# Patient Record
Sex: Female | Born: 2005 | Race: White | Hispanic: No | Marital: Single | Smoking: Never smoker
Health system: Southern US, Community
[De-identification: ages and names within clinical notes are randomized; demographics above are authoritative.]

## PROBLEM LIST (undated history)

## (undated) DIAGNOSIS — F419 Anxiety disorder, unspecified: Secondary | ICD-10-CM

## (undated) DIAGNOSIS — T7840XA Allergy, unspecified, initial encounter: Secondary | ICD-10-CM

## (undated) DIAGNOSIS — J302 Other seasonal allergic rhinitis: Secondary | ICD-10-CM

## (undated) DIAGNOSIS — K219 Gastro-esophageal reflux disease without esophagitis: Secondary | ICD-10-CM

## (undated) HISTORY — DX: Anxiety disorder, unspecified: F41.9

## (undated) HISTORY — DX: Allergy, unspecified, initial encounter: T78.40XA

## (undated) HISTORY — DX: Other seasonal allergic rhinitis: J30.2

## (undated) HISTORY — DX: Gastro-esophageal reflux disease without esophagitis: K21.9

---
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
## 2023-01-03 ENCOUNTER — Encounter: Payer: Self-pay | Admitting: Family Medicine

## 2023-01-03 ENCOUNTER — Ambulatory Visit (INDEPENDENT_AMBULATORY_CARE_PROVIDER_SITE_OTHER): Payer: Commercial Managed Care - PPO | Admitting: Family Medicine

## 2023-01-03 VITALS — BP 119/84 | HR 93 | Temp 98.9°F | Resp 18 | Ht 65.77 in | Wt 143.2 lb

## 2023-01-03 DIAGNOSIS — Z0000 Encounter for general adult medical examination without abnormal findings: Secondary | ICD-10-CM | POA: Diagnosis not present

## 2023-01-03 DIAGNOSIS — J4520 Mild intermittent asthma, uncomplicated: Secondary | ICD-10-CM | POA: Diagnosis not present

## 2023-01-03 DIAGNOSIS — L700 Acne vulgaris: Secondary | ICD-10-CM

## 2023-01-03 LAB — CBC WITH DIFFERENTIAL/PLATELET
Basophils Absolute: 0 10*3/uL (ref 0.0–0.1)
Basophils Relative: 0.3 % (ref 0.0–3.0)
Eosinophils Absolute: 0.1 10*3/uL (ref 0.0–0.7)
Eosinophils Relative: 1 % (ref 0.0–5.0)
HCT: 40.7 % (ref 36.0–49.0)
Hemoglobin: 13.4 g/dL (ref 12.0–16.0)
Lymphocytes Relative: 22.9 % — ABNORMAL LOW (ref 24.0–48.0)
Lymphs Abs: 1.2 10*3/uL (ref 0.7–4.0)
MCHC: 32.9 g/dL (ref 31.0–37.0)
MCV: 93.3 fl (ref 78.0–98.0)
Monocytes Absolute: 0.4 10*3/uL (ref 0.1–1.0)
Monocytes Relative: 7.1 % (ref 3.0–12.0)
Neutro Abs: 3.6 10*3/uL (ref 1.4–7.7)
Neutrophils Relative %: 68.7 % (ref 43.0–71.0)
Platelets: 324 10*3/uL (ref 150.0–575.0)
RBC: 4.36 Mil/uL (ref 3.80–5.70)
RDW: 12.3 % (ref 11.4–15.5)
WBC: 5.2 10*3/uL (ref 4.5–13.5)

## 2023-01-03 LAB — COMPREHENSIVE METABOLIC PANEL
ALT: 9 U/L (ref 0–35)
AST: 14 U/L (ref 0–37)
Albumin: 4.7 g/dL (ref 3.5–5.2)
Alkaline Phosphatase: 75 U/L (ref 47–119)
BUN: 8 mg/dL (ref 6–23)
CO2: 24 meq/L (ref 19–32)
Calcium: 9.7 mg/dL (ref 8.4–10.5)
Chloride: 108 meq/L (ref 96–112)
Creatinine, Ser: 0.78 mg/dL (ref 0.40–1.20)
GFR: 110.32 mL/min (ref >60.00)
Glucose, Bld: 92 mg/dL (ref 70–99)
Potassium: 3.9 meq/L (ref 3.5–5.1)
Sodium: 140 meq/L (ref 135–145)
Total Bilirubin: 0.6 mg/dL (ref 0.2–1.2)
Total Protein: 7.4 g/dL (ref 6.0–8.3)

## 2023-01-03 LAB — LIPID PANEL
Cholesterol: 144 mg/dL (ref 0–200)
HDL: 54.7 mg/dL (ref >39.00)
LDL Cholesterol: 82 mg/dL (ref 0–99)
NonHDL: 89.04
Total CHOL/HDL Ratio: 3
Triglycerides: 33 mg/dL (ref 0.0–149.0)
VLDL: 6.6 mg/dL (ref 0.0–40.0)

## 2023-01-03 LAB — HEMOGLOBIN A1C: Hgb A1c MFr Bld: 5.1 % (ref 4.6–6.5)

## 2023-01-03 MED ORDER — DRYSOL 20 % EX SOLN: Freq: Every day | CUTANEOUS | 3 refills | Status: DC

## 2023-01-03 MED ORDER — NORGESTIM-ETH ESTRAD TRIPHASIC 0.18/0.215/0.25 MG-25 MCG PO TABS: 1.0000 | ORAL_TABLET | Freq: Every day | ORAL | 4 refills | Status: DC

## 2023-01-03 NOTE — Progress Notes (Signed)
Phone [phone]   Subjective:   Patient is a 17 y.o. female presenting for annual physical.    Chief Complaint  Patient presents with   Annual Exam    CPE Fasting   HPI  Annual- Exercising regularly and maintaining healthy diet.   Asthma- Reports she uses Albuterol inhaler as needed-doing much better  Allergies- Reports she stopped using Sinulair 10 mg 2-3 months ago since allergies have improved and uses Ryaltris 665-35 mcg. States she has established with allergist  Birth Control - Discussed restarting birth control; previously used OCP. Recently sexual active again. Periods have been regular. Always wears condoms  Foot Odor -  Reports using Drysol 20% solution as needed.    Acne - Reports using Bezoyl Peroxide 5% as needed. And wants to get back on ocp  See problem oriented charting- ROS- ROS: Gen: no fever, chills  Skin: no rash, itching ENT: no ear pain, ear drainage, nasal congestion, rhinorrhea, sinus pressure, sore throat Eyes: no blurry vision, double vision Resp: no cough, wheeze,SOB CV: no CP, palpitations, LE edema,  GI: no heartburn, n/v/d/c, abd pain GU: no dysuria, urgency, frequency, hematuria MSK: no joint pain, myalgias, back pain Neuro: no dizziness, headache, weakness, vertigo Psych: no depression, anxiety, insomnia, SI   The following were reviewed and entered/updated in epic: Past Medical History:  Diagnosis Date   Acid reflux    Not seen by primary for evaluation   Allergy    Anxiety    Seasonal allergies    Patient Active Problem List   Diagnosis Date Noted   Respiratory infection 05/16/2022   Seasonal and perennial allergic rhinitis 04/25/2022   Mild intermittent asthma without complication 04/25/2022   Acid reflux    Acid reflux    History reviewed. No pertinent surgical history.  Family History  Problem Relation Age of Onset   Drug abuse Mother    Depression Mother    Alcohol abuse Mother        in recovery    ADD / ADHD  Mother    Allergic rhinitis Father    Heart disease Father    Healthy Father    Eczema Brother    Asthma Brother    Depression Maternal Aunt    Depression Maternal Grandmother     Medications- reviewed and updated Current Outpatient Medications  Medication Sig Dispense Refill   albuterol (VENTOLIN HFA) 108 (90 Base) MCG/ACT inhaler Inhale 2 puffs into the lungs every 6 (six) hours as needed for wheezing or shortness of breath. 6.7 g 0   benzoyl peroxide 5 % external liquid Apply topically 2 (two) times daily. 142 g 12   cetirizine (ZYRTEC) 10 MG chewable tablet Chew 10 mg by mouth as needed for allergies.     montelukast (SINGULAIR) 10 MG tablet Take 1 tablet (10 mg total) by mouth at bedtime. 30 tablet 5   Multiple Vitamin (MULTIVITAMIN PO) Take by mouth.     Norgestimate-Ethinyl Estradiol Triphasic 0.18/0.215/0.25 MG-25 MCG tab Take 1 tablet by mouth daily. 84 tablet 4   Olopatadine-Mometasone (RYALTRIS) 665-25 MCG/ACT SUSP Place 1-2 sprays into the nose in the morning and at bedtime. 29 g 5   DRYSOL 20 % external solution Apply topically at bedtime. 60 mL 3   No current facility-administered medications for this visit.    Allergies-reviewed and updated Allergies  Allergen Reactions   Cefuroxime Axetil Hives and Other (See Comments)    Other reaction(s): Other (See Comments)  Hives   Cefdinir Rash and Other (  See Comments)    Other reaction(s): Other (See Comments)  Other Reaction(s): Hives    Social History   Social History Narrative   [employer]   [place] in [place].   Objective  Objective:  BP 119/84   Pulse 93   Temp 98.9 F (37.2 C) (Temporal)   Resp 18   Ht 5' 5.77" (1.671 m)   Wt 143 lb 4 oz (65 kg)   LMP 12/29/2022 (Exact Date)   SpO2 98%   BMI 23.28 kg/m  Physical Exam  Gen: WDWN NAD HEENT: NCAT, conjunctiva not injected, sclera nonicteric TM WNL B, OP moist, no exudates  NECK:  supple, no thyromegaly, no nodes, no carotid  bruits CARDIAC: RRR, S1S2+, no murmur. DP 2+B LUNGS: CTAB. No wheezes ABDOMEN:  BS+, soft, NTND, No HSM, no masses EXT:  no edema MSK: no gross abnormalities. MS 5/5 all 4 NEURO: A&O x3.  CN II-XII intact.  PSYCH: normal mood. Good eye contact      Assessment and Plan   Health Maintenance counseling: 1. Anticipatory guidance: Patient counseled regarding regular dental exams q6 months, eye exams,  avoiding smoking and second hand smoke, limiting alcohol to 1 beverage per day, no illicit drugs.   2. Risk factor reduction:  Advised patient of need for regular exercise and diet rich and fruits and vegetables to reduce risk of heart attack and stroke. Exercise- Regular exercise.  Wt Readings from Last 3 Encounters:  01/03/23 143 lb 4 oz (65 kg) (75%, Z= 0.68)*  06/27/22 147 lb (66.7 kg) (80%, Z= 0.86)*  04/25/22 148 lb (67.1 kg) (82%, Z= 0.90)*   * Growth percentiles are based on CDC (Girls, 2-20 Years) data.   3. Immunizations/screenings/ancillary studies Immunization History  Administered Date(s) Administered   HPV 9-valent 01/19/2018, 09/03/2019   Influenza,inj,Quad PF,6+ Mos 02/14/2014, 05/05/2017, 01/19/2018   Influenza-Unspecified 12/20/2015   Meningococcal Conjugate 12/20/2015   PFIZER Comirnaty(Gray Top)Covid-19 Tri-Sucrose Vaccine 11/18/2020   PFIZER(Purple Top)SARS-COV-2 Vaccination 09/29/2019, 10/20/2019   Tdap 12/20/2015   There are no preventive care reminders to display for this patient.   4. Cervical cancer screening: N/A 5. Skin cancer screening- advised regular sunscreen use. Denies worrisome, changing, or new skin lesions.  6. Birth control/STD check: Discussed restarting birth control. She wasn't sexually active when she stopped them.  7. Smoking associated screening: non-smoker 8. Alcohol screening: N/A  Wellness examination -     CBC with Differential/Platelet -     Comprehensive metabolic panel -     Lipid panel -     TSH -     Hemoglobin A1c  Mild  intermittent asthma without complication  Acne vulgaris  Other orders -     Drysol; Apply topically at bedtime.  Dispense: 60 mL; Refill: 3 -     Norgestim-Eth Estrad Triphasic; Take 1 tablet by mouth daily.  Dispense: 84 tablet; Refill: 4   Wellness-anticipatory guidance.  Work on Diet/Exercise  Check CBC,CMP,lipids,TSH, A1C.  F/u 1 yr  will restart ortho tricyclen for contraception/acne Asthma-well controlled.  Care per allergist Acne-continue benzoyl peroxide.  Restart ortho tricyclen   Recommended follow up: Return in about 1 year (around 01/03/2024) for annual physical.  Lab/Order associations: + fasting   I,Rachel Rivera,acting as a scribe for Angelena Sole, MD.,have documented all relevant documentation on the behalf of Angelena Sole, MD,as directed by  Angelena Sole, MD while in the presence of Angelena Sole, MD.  I, Angelena Sole, MD, have reviewed all documentation  for this visit. The documentation on 01/03/23 for the exam, diagnosis, procedures, and orders are all accurate and complete.     Angelena Sole, MD

## 2023-01-03 NOTE — Patient Instructions (Signed)

## 2023-01-05 LAB — TSH: TSH: 1.27 u[IU]/mL (ref 0.40–5.00)

## 2023-02-21 NOTE — Patient Instructions (Incomplete)
Seasonal and perennial allergic rhinitis 2023 skin testing showed: Positive to grass, weed, trees, ragweed, dust mites. Continue environmental control measures as below. Use over the counter antihistamines such as Zyrtec (cetirizine), Claritin (loratadine), Allegra (fexofenadine), or Xyzal (levocetirizine) daily as needed. May take twice a day during allergy flares. May switch antihistamines every few months. Restart Singulair (montelukast) 10mg  daily at night. Patient cautioned that rarely some children/adults can experience behavioral changes after beginning montelukast. These side effects are rare, however, if you notice any change, notify the clinic and discontinue montelukast.  May use Ryaltris (olopatadine + mometasone nasal spray combination) 1-2 sprays per nostril twice a day.  May use saline gel for nasal dryness Nasal saline spray (i.e., Simply Saline) or nasal saline lavage (i.e., NeilMed) is recommended as needed and prior to medicated nasal sprays. Consider allergy injections for long term control if above medications do not help the symptoms - handout mailed. Schedule an appointment to start allergy injections when you are back during Thanksgiving break. Forms given for her university to sign .  It would be one shot.  Emergency action plan given.  Prescription for EpiPen sent.  Demonstration given on how to use EpiPen  Mild intermittent asthma We will get a chest x-ray due to the black sputum. We will call you with results once they are back Restart Singulair (montelukast) 10mg  daily at night. May use albuterol rescue inhaler 2 puffs every 4 to 6 hours as needed for shortness of breath, chest tightness, coughing, and wheezing. Monitor frequency of use.   Follow up in 3 months or sooner if needed  Reducing Pollen Exposure Pollen seasons: trees (spring), grass (summer) and ragweed/weeds (fall). Keep windows closed in your home and car to lower pollen exposure.  Install air  conditioning in the bedroom and throughout the house if possible.  Avoid going out in dry windy days - especially early morning. Pollen counts are highest between 5 - 10 AM and on dry, hot and windy days.  Save outside activities for late afternoon or after a heavy rain, when pollen levels are lower.  Avoid mowing of grass if you have grass pollen allergy. Be aware that pollen can also be transported indoors on people and pets.  Dry your clothes in an automatic dryer rather than hanging them outside where they might collect pollen.  Rinse hair and eyes before bedtime.  Control of House Dust Mite Allergen Dust mite allergens are a common trigger of allergy and asthma symptoms. While they can be found throughout the house, these microscopic creatures thrive in warm, humid environments such as bedding, upholstered furniture and carpeting. Because so much time is spent in the bedroom, it is essential to reduce mite levels there.  Encase pillows, mattresses, and box springs in special allergen-proof fabric covers or airtight, zippered plastic covers.  Bedding should be washed weekly in hot water (130 F) and dried in a hot dryer. Allergen-proof covers are available for comforters and pillows that can't be regularly washed.  Wash the allergy-proof covers every few months. Minimize clutter in the bedroom. Keep pets out of the bedroom.  Keep humidity less than 50% by using a dehumidifier or air conditioning. You can buy a humidity measuring device called a hygrometer to monitor this.  If possible, replace carpets with hardwood, linoleum, or washable area rugs. If that's not possible, vacuum frequently with a vacuum that has a HEPA filter. Remove all upholstered furniture and non-washable window drapes from the bedroom. Remove all non-washable stuffed toys  from the bedroom.  Wash stuffed toys weekly.

## 2023-02-22 ENCOUNTER — Other Ambulatory Visit: Payer: Self-pay

## 2023-02-22 ENCOUNTER — Ambulatory Visit (HOSPITAL_COMMUNITY)
Admission: RE | Admit: 2023-02-22 | Discharge: 2023-02-22 | Disposition: A | Discharge status: Home / Self Care | Payer: Commercial Managed Care - PPO | Source: Ambulatory Visit | Attending: Family | Admitting: Family

## 2023-02-22 ENCOUNTER — Ambulatory Visit: Payer: Commercial Managed Care - PPO | Admitting: Family

## 2023-02-22 ENCOUNTER — Encounter: Payer: Self-pay | Admitting: Family

## 2023-02-22 VITALS — BP 104/70 | HR 81 | Temp 98.3°F | Resp 16 | Wt 146.3 lb

## 2023-02-22 DIAGNOSIS — J3089 Other allergic rhinitis: Secondary | ICD-10-CM | POA: Diagnosis not present

## 2023-02-22 DIAGNOSIS — R058 Other specified cough: Secondary | ICD-10-CM | POA: Diagnosis not present

## 2023-02-22 DIAGNOSIS — J4520 Mild intermittent asthma, uncomplicated: Secondary | ICD-10-CM | POA: Diagnosis not present

## 2023-02-22 DIAGNOSIS — J302 Other seasonal allergic rhinitis: Secondary | ICD-10-CM

## 2023-02-22 MED ORDER — EPINEPHRINE 0.3 MG/0.3ML IJ SOAJ: 0.3000 mg | INTRAMUSCULAR | 0 refills | Status: AC | PRN

## 2023-02-22 NOTE — Progress Notes (Signed)
[address] [place] [address] Dept: [phone]  FOLLOW UP NOTE  Patient ID: Heather Holloway, female    DOB: 01/05/2006  Age: 17 y.o. MRN: [id number] Date of Office Visit: 02/22/2023  Assessment  Chief Complaint: Other (Mucous that was black happened last week. Random in nature.)  HPI Heather Holloway is a 17 year old female who presents today for an acute visit of black sputum.  She was last seen on June 27, 2022 by Dr. Selena Batten for seasonal and perennial allergic rhinitis and mild intermittent asthma.  She denies any new diagnosis or surgery since her last office visit.  She reports that on October 3 she coughed up sputum that had black in it once.  She  used to have a persistent cough that was disruptive.  That cough is now gone.  She now has a cough that occurs randomly due to drainage.  She has not coughed up the black sputum again.  She denies any fever or chills.  She does not smoke.  She is not currently pregnant.  She reports that this could have happened before, but this is the first time she has looked at her sputum.  She does have a photo on her phone of the sputum.  Seasonal and perennial allergic rhinitis: She reports off-and-on rhinorrhea, nasal congestion, and postnasal drip at times.  Her rhinorrhea can be light yellow or green or sometimes clear.  She reports that she probably had a sinus infection last March.  She is interested in starting allergy injections.  She is in school right now at [employer].  She reports that she has already called her insurance to find out the cost.  She is currently taking Zyrtec 10 mg once a day and Ryaltris nasal spray maybe twice a week.  She stopped taking Singulair 10 mg at night.  She did not have any problems while on this medication.  She thought that it did help.  She does mention that sometimes if she uses Ryaltris too much she feels like it dries her out.  She reports that she will occasionally have nosebleeds.  Her last nosebleed was 2  weeks ago.  Mild intermittent asthma: She reports cough due to drainage otherwise she does not have cough.  She denies wheezing, tightness in chest, shortness of breath, and nocturnal awakenings due to breathing problems.  Since her last office visit she has not required any systemic steroids or made any trips to the emergency room or urgent care due to breathing problems.  She reports she has used her albuterol inhaler once for a coughing fit since we last saw her.  She is currently not taking Singulair 10 mg at night.   Drug Allergies:  Allergies  Allergen Reactions   Cefuroxime Axetil Hives and Other (See Comments)    Other reaction(s): Other (See Comments)  Hives   Cefdinir Rash and Other (See Comments)    Other reaction(s): Other (See Comments)  Other Reaction(s): Hives    Review of Systems: Negative except as per HPI   Physical Exam: BP 104/70   Pulse 81   Temp 98.3 F (36.8 C) (Temporal)   Resp 16   Wt 146 lb 4.8 oz (66.4 kg)   SpO2 98%   BMI 23.78 kg/m    Physical Exam Constitutional:      Appearance: Normal appearance.  HENT:     Head: Normocephalic and atraumatic.     Comments: Pharynx normal, eyes normal, ears normal, nose: Bilateral lower turbinates mildly  edematous with no drainage noted    Right Ear: Tympanic membrane, ear canal and external ear normal.     Left Ear: Tympanic membrane, ear canal and external ear normal.     Mouth/Throat:     Mouth: Mucous membranes are moist.     Pharynx: Oropharynx is clear.  Eyes:     Conjunctiva/sclera: Conjunctivae normal.  Cardiovascular:     Rate and Rhythm: Regular rhythm.     Heart sounds: Normal heart sounds.  Pulmonary:     Effort: Pulmonary effort is normal.     Breath sounds: Normal breath sounds.     Comments: Lungs clear to auscultation Musculoskeletal:     Cervical back: Neck supple.  Skin:    General: Skin is warm.  Neurological:     Mental Status: She is alert and oriented to person, place, and  time.  Psychiatric:        Mood and Affect: Mood normal.        Behavior: Behavior normal.        Thought Content: Thought content normal.        Judgment: Judgment normal.     Diagnostics: FVC 3.36 L (84%), FEV1 2.94 L (84%), FEV1/FVC 0.88.  Spirometry indicates normal spirometry.  Assessment and Plan: 1. Sputum production   2. Mild intermittent asthma without complication   3. Seasonal and perennial allergic rhinitis     Meds ordered this encounter  Medications   EPINEPHrine 0.3 mg/0.3 mL IJ SOAJ injection    Sig: Inject 0.3 mg into the muscle as needed for anaphylaxis.    Dispense:  2 each    Refill:  0    Patient Instructions  Seasonal and perennial allergic rhinitis 2023 skin testing showed: Positive to grass, weed, trees, ragweed, dust mites. Continue environmental control measures as below. Use over the counter antihistamines such as Zyrtec (cetirizine), Claritin (loratadine), Allegra (fexofenadine), or Xyzal (levocetirizine) daily as needed. May take twice a day during allergy flares. May switch antihistamines every few months. Restart Singulair (montelukast) 10mg  daily at night. Patient cautioned that rarely some children/adults can experience behavioral changes after beginning montelukast. These side effects are rare, however, if you notice any change, notify the clinic and discontinue montelukast.  May use Ryaltris (olopatadine + mometasone nasal spray combination) 1-2 sprays per nostril twice a day.  May use saline gel for nasal dryness Nasal saline spray (i.e., Simply Saline) or nasal saline lavage (i.e., NeilMed) is recommended as needed and prior to medicated nasal sprays. Consider allergy injections for long term control if above medications do not help the symptoms - handout mailed. Schedule an appointment to start allergy injections when you are back during Thanksgiving break. Forms given for her university to sign .  It would be one shot.  Emergency action  plan given.  Prescription for EpiPen sent.  Demonstration given on how to use EpiPen  Mild intermittent asthma We will get a chest x-ray due to the black sputum. We will call you with results once they are back Restart Singulair (montelukast) 10mg  daily at night. May use albuterol rescue inhaler 2 puffs every 4 to 6 hours as needed for shortness of breath, chest tightness, coughing, and wheezing. Monitor frequency of use.   Follow up in 3 months or sooner if needed  Reducing Pollen Exposure Pollen seasons: trees (spring), grass (summer) and ragweed/weeds (fall). Keep windows closed in your home and car to lower pollen exposure.  Install air conditioning in the bedroom and throughout the  house if possible.  Avoid going out in dry windy days - especially early morning. Pollen counts are highest between 5 - 10 AM and on dry, hot and windy days.  Save outside activities for late afternoon or after a heavy rain, when pollen levels are lower.  Avoid mowing of grass if you have grass pollen allergy. Be aware that pollen can also be transported indoors on people and pets.  Dry your clothes in an automatic dryer rather than hanging them outside where they might collect pollen.  Rinse hair and eyes before bedtime.  Control of House Dust Mite Allergen Dust mite allergens are a common trigger of allergy and asthma symptoms. While they can be found throughout the house, these microscopic creatures thrive in warm, humid environments such as bedding, upholstered furniture and carpeting. Because so much time is spent in the bedroom, it is essential to reduce mite levels there.  Encase pillows, mattresses, and box springs in special allergen-proof fabric covers or airtight, zippered plastic covers.  Bedding should be washed weekly in hot water (130 F) and dried in a hot dryer. Allergen-proof covers are available for comforters and pillows that can't be regularly washed.  Wash the allergy-proof covers every  few months. Minimize clutter in the bedroom. Keep pets out of the bedroom.  Keep humidity less than 50% by using a dehumidifier or air conditioning. You can buy a humidity measuring device called a hygrometer to monitor this.  If possible, replace carpets with hardwood, linoleum, or washable area rugs. If that's not possible, vacuum frequently with a vacuum that has a HEPA filter. Remove all upholstered furniture and non-washable window drapes from the bedroom. Remove all non-washable stuffed toys from the bedroom.  Wash stuffed toys weekly.  Return in about 3 months (around 05/25/2023), or if symptoms worsen or fail to improve.    Thank you for the opportunity to care for this patient.  Please do not hesitate to contact me with questions.  Nehemiah Settle, FNP [facility]

## 2023-02-22 NOTE — Progress Notes (Signed)
Please let Heather Holloway know that her chest x-ray looks good and does not show any active cardiopulmonary disease.

## 2023-03-01 ENCOUNTER — Telehealth: Payer: Self-pay | Admitting: Allergy

## 2023-03-01 ENCOUNTER — Other Ambulatory Visit: Payer: Self-pay | Admitting: Allergy

## 2023-03-01 DIAGNOSIS — J3089 Other allergic rhinitis: Secondary | ICD-10-CM

## 2023-03-01 MED ORDER — MONTELUKAST SODIUM 10 MG PO TABS: 10.0000 mg | ORAL_TABLET | Freq: Every day | ORAL | 5 refills | Status: DC

## 2023-03-01 NOTE — Telephone Encounter (Signed)
Sent in montelukast and informed pt of me doing so to right [facility] [place]

## 2023-03-01 NOTE — Telephone Encounter (Signed)
Patient called and stated her prescription for Singulair was sent over to her home town Pharmacy. Patient goes to college in [place] and wants the prescription sent [facility] to [facility] at [address] [place], [place] [address]. Patient asked if someone could give her a call once that is sent in.  Best Contact: [phone]

## 2023-03-01 NOTE — Addendum Note (Signed)
Addended by: Berna Bue on: 03/01/2023 11:53 AM   Modules accepted: Orders

## 2023-03-02 DIAGNOSIS — J301 Allergic rhinitis due to pollen: Secondary | ICD-10-CM | POA: Diagnosis not present

## 2023-03-02 NOTE — Progress Notes (Signed)
EXP 03/01/24

## 2023-03-02 NOTE — Progress Notes (Signed)
Aeroallergen Immunotherapy  Ordering Provider: Dr. Wyline Mood  Patient Details Name: Heather Holloway MRN: [id number] Date of Birth: 10/05/2005  Order 1 of 1  Vial Label: G-RW-W-T-DM  0.3 ml (Volume)  BAU Concentration -- 7 Grass Mix* 100,000 ([address] [place], [place], [place], [place], [place], Sweet Vernal, Timothy) 0.3 ml (Volume)  BAU Concentration -- [place] 10,000 0.2 ml (Volume)  1:20 Concentration -- [place] 0.3 ml (Volume)  1:20 Concentration -- Ragweed Mix 0.5 ml (Volume)  1:20 Concentration -- Weed Mix* 0.5 ml (Volume)  1:20 Concentration -- Eastern 10 Tree Mix (also Sweet Gum) 0.2 ml (Volume)  1:20 Concentration -- Box Elder 0.2 ml (Volume)  1:10 Concentration -- Pecan Pollen 0.2 ml (Volume)  1:20 Concentration -- Walnut, Black Pollen 0.5 ml (Volume)   AU Concentration -- Mite Mix (DF 5,000 & DP 5,000)   3.2  ml Extract Subtotal 1.8  ml Diluent 5.0  ml Maintenance Total  Schedule:  B Blue Vial (1:100,000): Schedule B (6 doses) Yellow Vial (1:10,000): Schedule B (6 doses) Green Vial (1:1,000): Schedule B (6 doses) Red Vial (1:100): Schedule A (14 doses)  Special Instructions: may come in 1-2 times per week during build up. Only once per week on red.

## 2023-03-16 ENCOUNTER — Ambulatory Visit: Payer: Commercial Managed Care - PPO

## 2023-04-10 ENCOUNTER — Ambulatory Visit (INDEPENDENT_AMBULATORY_CARE_PROVIDER_SITE_OTHER): Payer: Commercial Managed Care - PPO

## 2023-04-10 DIAGNOSIS — J309 Allergic rhinitis, unspecified: Secondary | ICD-10-CM

## 2023-04-10 NOTE — Progress Notes (Signed)
Immunotherapy   Patient Details  Name: Heather Holloway MRN: [id number] Date of Birth: Feb 09, 2006  04/10/2023  [name] started injections for G-RW-W-T-DM. Patient received 0.05 ml out of her blue vial with an exp date of 03/01/2024. Patient waited 30 minutes with no problems. Following schedule: B  Frequency:1-2 times weekly Epi-Pen:Epi-Pen Available  Consent signed and patient instructions given.   Dub Mikes 04/10/2023, 1:39 PM

## 2023-05-04 ENCOUNTER — Ambulatory Visit (INDEPENDENT_AMBULATORY_CARE_PROVIDER_SITE_OTHER): Payer: Commercial Managed Care - PPO

## 2023-05-04 DIAGNOSIS — J309 Allergic rhinitis, unspecified: Secondary | ICD-10-CM

## 2023-07-04 DIAGNOSIS — J029 Acute pharyngitis, unspecified: Secondary | ICD-10-CM | POA: Diagnosis not present

## 2023-07-04 DIAGNOSIS — J020 Streptococcal pharyngitis: Secondary | ICD-10-CM | POA: Diagnosis not present

## 2023-08-14 DIAGNOSIS — J029 Acute pharyngitis, unspecified: Secondary | ICD-10-CM | POA: Diagnosis not present

## 2023-08-14 DIAGNOSIS — Z20828 Contact with and (suspected) exposure to other viral communicable diseases: Secondary | ICD-10-CM | POA: Diagnosis not present

## 2023-12-28 ENCOUNTER — Encounter: Payer: Commercial Managed Care - PPO | Admitting: Family Medicine

## 2024-04-08 DIAGNOSIS — D224 Melanocytic nevi of scalp and neck: Secondary | ICD-10-CM | POA: Diagnosis not present

## 2024-04-08 DIAGNOSIS — D2262 Melanocytic nevi of left upper limb, including shoulder: Secondary | ICD-10-CM | POA: Diagnosis not present

## 2024-04-08 DIAGNOSIS — D225 Melanocytic nevi of trunk: Secondary | ICD-10-CM | POA: Diagnosis not present

## 2024-04-22 DIAGNOSIS — R519 Headache, unspecified: Secondary | ICD-10-CM | POA: Diagnosis not present

## 2024-04-22 DIAGNOSIS — J029 Acute pharyngitis, unspecified: Secondary | ICD-10-CM | POA: Diagnosis not present

## 2024-04-22 DIAGNOSIS — R5383 Other fatigue: Secondary | ICD-10-CM | POA: Diagnosis not present

## 2024-04-22 NOTE — Progress Notes (Signed)
 "  Chief Complaint:  Chief Complaint  Patient presents with   Sore Throat   PCP: Dr, No Pcp/Family  Assessment/Plan    ICD-10-CM   1. Sore throat  J02.9 STREP SCREEN RAPID GROUP A(AMB USE ONLY)    POC SARS-COV2, INFLU. A/B ANTIGEN FIA    CHLAMYDIA-GC, RNA, TMA,THROAT (V8267)    CULT THROAT [facility])       Assessment & Plan Initial Assessment: 18 year old female with sore throat, fatigue, headache, decreased appetite, and diarrhea since Sunday. Recent unprotected oral sex on Friday night. Concern for potential STI and throat infection. Negative rapid strep test in clinic.  Differential Diagnosis: - Strep throat: Hallmark symptoms present. Rapid strep test negative. Send swab for culture. - Gonorrhea: Recent unprotected oral sex. Throat infection resembling strep. Send throat nucleic acid amplification test. - Mononucleosis: Symptoms consistent. Exposure at social event. Early test unreliable.  Urgent Care Course: - Negative rapid strep test in clinic - Throat culture sent - Throat nucleic acid amplification test for gonorrhea and chlamydia sent  Final Assessment: Negative rapid strep test. Throat culture and nucleic acid amplification test sent. Symptomatic management initiated.  Clinical Impression: - Pharyngitis  Disposition: - Follow-Up: Await throat culture and nucleic acid amplification test results. Contact for additional treatment if positive.  Patient Education: Ibuprofen 600-800 mg up to every 8 hours. High fluid intake. Cepacol spray or lozenges for numbing. Saltwater gargles.  Sidenote: confirmed hx of hives with cefdinir.  Unsure about other cephalosporins.  Can take amoxicillin  safely  --------------------------------------------------- ---------------------------------------------------  Subjective  History of Present Illness This is a 18 year old female presenting with sore throat, fatigue, headache, and decreased appetite since 04/20/2024. She reports  having unprotected oral sex on 04/18/2024 and is concerned about a potential STI in her throat.  She reports experiencing symptoms of pharyngitis, including erythematous tonsils and dysphagia, which began on 04/20/2024. She also describes fatigue, chills, headache, and a sore throat. A mild cough is present, which she notes is a common occurrence for her, along with expectoration of phlegm. She has been experiencing diarrhea but reports no other gastrointestinal symptoms such as nausea or vomiting. She has a history of recurrent streptococcal infections, with this episode being the fourth within the current year. She has not sought any medical treatment for her symptoms but has attempted self-care measures such as ibuprofen and saltwater gargles. She expresses concern about potential exposure to mononucleosis at a social event where she may have shared drinks with others. She requests a medical note for an upcoming examination due to her current illness.  Patient Active Problem List  Diagnosis   Acid reflux   Mild intermittent asthma without complication   Seasonal and perennial allergic rhinitis     Outpatient Medications as of 04/22/2024  Medication Indication(s) Sig   cetirizine (ZYRTEC) 10 mg Tablet   take 10 mg every day by mouth .     Allergies  Allergen Reactions   Cefdinir Hives     ROS  Negative unless otherwise indicated  Objective  Vitals:   04/22/24 1707  BP: 128/77  Pulse: 114  Resp: 16  Temp: 98.8 F (37.1 C)  SpO2: 98%  Weight: 63.5 kg (140 lb)  Height: 1.651 m (5' 5)     Physical Exam  Physical Exam General Appearance: Alert and oriented x3. No acute distress. HEENT: Nasal mucosa mildly congested. Ears normal. Oropharynx shows prominent bilateral tonsillar hypertrophy and multiple white exudates bilaterally. No midline shift or asymmetry. Respiratory: Lungs clear to auscultation bilaterally.  Cardiovascular: Heart regular rate and rhythm with no murmurs,  gallops, or rubs. Lymphatic: Tender cervical lymph node enlargement. Skin: Warm and dry, no visible rash. Neurological: No global CNS or focal neurologic deficits to interaction. Psychiatric: Normal.    Results for orders placed or performed in visit on 04/22/24  STREP SCREEN RAPID GROUP A(AMB USE ONLY)   Specimen: THROAT  Result Value Ref Range   AMB RAPID STREP V2 Negative Negative   Internal control result ok Valid  POC SARS-COV2, INFLU. A/B ANTIGEN FIA  Result Value Ref Range   SARS-COV-2 Antigen Negative Negative, Indeterminate, Not Detected, Valid   Flu A Antigen Negative Negative, Indeterminate, Not Detected, Valid   Flu B Antigen Negative Negative, Indeterminate, Not Detected, Valid     No results found.   Results - Laboratory Studies:   - Negative rapid strep test   - COVID-19 test pending   - Influenza test pending    No images are attached to the encounter.   Procedures   Discussed with the patient that I would be using [employer] notetaking software, and obtained consent prior to conducting the visit Portions of this note were dictated using Dragon medical dictation software and may contain occasional typographical errors.  Please seek clarification from the author as needed.  Francis SHAUNNA Irish, MD, Family Physician "

## 2024-05-19 ENCOUNTER — Ambulatory Visit
Admission: RE | Admit: 2024-05-19 | Discharge: 2024-05-19 | Disposition: A | Discharge status: Home / Self Care | Attending: Family Medicine | Admitting: Family Medicine

## 2024-05-19 ENCOUNTER — Ambulatory Visit: Payer: Self-pay

## 2024-05-19 VITALS — BP 124/84 | HR 85 | Temp 97.9°F | Resp 18 | Ht 65.0 in | Wt 145.0 lb

## 2024-05-19 DIAGNOSIS — J329 Chronic sinusitis, unspecified: Secondary | ICD-10-CM

## 2024-05-19 DIAGNOSIS — J069 Acute upper respiratory infection, unspecified: Secondary | ICD-10-CM | POA: Diagnosis not present

## 2024-05-19 MED ORDER — AZELASTINE-FLUTICASONE 137-50 MCG/ACT NA SUSP: 1.0000 | Freq: Two times a day (BID) | NASAL | 0 refills | Status: AC

## 2024-05-19 MED ORDER — AZITHROMYCIN 250 MG PO TABS: 250.0000 mg | ORAL_TABLET | Freq: Every day | ORAL | 0 refills | Status: AC

## 2024-05-19 MED ORDER — BENZONATATE 200 MG PO CAPS: 200.0000 mg | ORAL_CAPSULE | Freq: Three times a day (TID) | ORAL | 0 refills | Status: AC | PRN

## 2024-05-19 NOTE — ED Triage Notes (Signed)
 Pt states that she has a cough and chest congestion. X3-4 days  Pt states that she recently has been treated for Mono.   Pt states that she has been taking Mucinex DM.

## 2024-05-19 NOTE — Telephone Encounter (Signed)
 FYI Only or Action Required?: FYI only for provider: going to UC.  Patient was last seen in primary care on 01/03/2023 by Wendolyn Jenkins Jansky, MD.  Called Nurse Triage reporting Cough.  Symptoms began several days ago.  Interventions attempted: OTC medications: throat spray.  Symptoms are: unchanged.  Triage Disposition: Home Care  Patient/caregiver understands and will follow disposition?: Going to UC d/t upcoming travel abroad  Reason for Disposition  Cough  Answer Assessment - Initial Assessment Questions Pt states she [facility] and she tested positive for mono. Pt has developed a productive cough. Has not been prescribed anything.   No OV available, pt opted for UC as she is traveling [place] in 4 days.   1. ONSET: When did the cough begin?  3. SPUTUM: Describe the color of your sputum (e.g., none, dry cough; clear, white, yellow, green)     Pale yellow 4. HEMOPTYSIS: Are you coughing up any blood? If Yes, ask: How much? (e.g., flecks, streaks, tablespoons, etc.)     Denies 5. DIFFICULTY BREATHING: Are you having difficulty breathing? If Yes, ask: How bad is it? (e.g., mild, moderate, severe)      Denies 6. FEVER: Do you have a fever? If Yes, ask: What is your temperature, how was it measured, and when did it start?     Denies  Protocols used: Cough - Acute Productive-A-AH Message from [place] J sent at 05/19/2024  8:46 AM EST  Reason for Triage:  pt calling to speak to dr wendolyn nurse, or to get a callback pt has questions in regards to follow up  chest congestion and coughing, test positive for mono, no pain, pale yellow mucus

## 2024-05-19 NOTE — Discharge Instructions (Signed)
 Continue to push lots of fluids Use the Dymista  twice a day to help with the sinus congestion and drainage May continue with Mucinex DM (guaifenesin DM) for moderate cough.  Take Tessalon  if cough is severe If you fail to see improvement over the next couple of days, or if your symptoms worsen I have given you a written prescription for a Z-Pak to fill and take

## 2024-05-19 NOTE — ED Provider Notes (Signed)
 " [facility]    CSN: [id number] Arrival date & time: 05/19/24  1535      History   Chief Complaint Chief Complaint  Patient presents with   Cough    Entered by patient    HPI Faiga Broxterman is a 19 y.o. female.   HPI  Patient is on a break.  Going back to college.  Her mother is a engineer, civil (consulting) in the hospital and brings her in for evaluation.  Concerned that she has worsening symptoms with cough and congestion for several days.  Mother worried she might need antibiotics.  Does not want to send her off sick.  She recently was quite sick with a monoinfection.  Has just gotten better  Past Medical History:  Diagnosis Date   Acid reflux    Not seen by primary for evaluation   Allergy     Anxiety    Seasonal allergies     Patient Active Problem List   Diagnosis Date Noted   Respiratory infection 05/16/2022   Seasonal and perennial allergic rhinitis 04/25/2022   Mild intermittent asthma without complication 04/25/2022   Acid reflux    Acid reflux     History reviewed. No pertinent surgical history.  OB History   No obstetric history on file.      Home Medications    Prior to Admission medications  Medication Sig Start Date End Date Taking? Authorizing Provider  Azelastine -Fluticasone  137-50 MCG/ACT SUSP Place 1 puff into the nose in the morning and at bedtime. 05/19/24  Yes Maranda Jamee Jacob, MD  azithromycin  (ZITHROMAX ) 250 MG tablet Take 1 tablet (250 mg total) by mouth daily. Take first 2 tablets together, then 1 every day until finished. 05/19/24  Yes Maranda Jamee Jacob, MD  benzonatate  (TESSALON ) 200 MG capsule Take 1 capsule (200 mg total) by mouth 3 (three) times daily as needed for cough. 05/19/24  Yes Maranda Jamee Jacob, MD  EPINEPHrine  0.3 mg/0.3 mL IJ SOAJ injection Inject 0.3 mg into the muscle as needed for anaphylaxis. 02/22/23   Cheryl Reusing, FNP    Family History Family History  Problem Relation Age of Onset   Drug abuse Mother    Depression  Mother    Alcohol abuse Mother        in recovery    ADD / ADHD Mother    Allergic rhinitis Father    Heart disease Father    Healthy Father    Eczema Brother    Asthma Brother    Depression Maternal Aunt    Depression Maternal Grandmother     Social History Social History[1]   Allergies   Cefuroxime axetil and Cefdinir   Review of Systems Review of Systems  See HPI Physical Exam Triage Vital Signs ED Triage Vitals  Encounter Vitals Group     BP 05/19/24 1559 124/84     Girls Systolic BP Percentile --      Girls Diastolic BP Percentile --      Boys Systolic BP Percentile --      Boys Diastolic BP Percentile --      Pulse Rate 05/19/24 1559 85     Resp 05/19/24 1559 18     Temp 05/19/24 1559 97.9 F (36.6 C)     Temp Source 05/19/24 1559 Oral     SpO2 05/19/24 1559 97 %     Weight 05/19/24 1555 145 lb (65.8 kg)     Height 05/19/24 1555 5' 5 (1.651 m)     Head  Circumference --      Peak Flow --      Pain Score 05/19/24 1555 0     Pain Loc --      Pain Education --      Exclude from Growth Chart --    No data found.  Updated Vital Signs BP 124/84 (BP Location: Right Arm)   Pulse 85   Temp 97.9 F (36.6 C) (Oral)   Resp 18   Ht 5' 5 (1.651 m)   Wt 65.8 kg   LMP 04/22/2024   SpO2 97%   BMI 24.13 kg/m       Physical Exam Constitutional:      General: She is not in acute distress.    Appearance: She is well-developed. She is not ill-appearing.  HENT:     Head: Normocephalic and atraumatic.     Right Ear: Tympanic membrane normal.     Left Ear: Tympanic membrane normal.     Nose: Nose normal.     Mouth/Throat:     Pharynx: Posterior oropharyngeal erythema present.  Eyes:     Conjunctiva/sclera: Conjunctivae normal.     Pupils: Pupils are equal, round, and reactive to light.  Cardiovascular:     Rate and Rhythm: Normal rate.     Heart sounds: Normal heart sounds.  Pulmonary:     Effort: Pulmonary effort is normal. No respiratory distress.      Breath sounds: Wheezing present.  Musculoskeletal:        General: Normal range of motion.     Cervical back: Normal range of motion.  Lymphadenopathy:     Cervical: No cervical adenopathy.  Skin:    General: Skin is warm and dry.  Neurological:     Mental Status: She is alert.      UC Treatments / Results  Labs (all labs ordered are listed, but only abnormal results are displayed) Labs Reviewed - No data to display  EKG   Radiology No results found.  Procedures Procedures (including critical care time)  Medications Ordered in UC Medications - No data to display  Initial Impression / Assessment and Plan / UC Course  I have reviewed the triage vital signs and the nursing notes.  Pertinent labs & imaging results that were available during my care of the patient were reviewed by me and considered in my medical decision making (see chart for details).     Discussed that she likely has a viral infection.  I will give her a Z-Pak to take in case she fails to see improvement. Final Clinical Impressions(s) / UC Diagnoses   Final diagnoses:  Viral URI with cough  Recurrent sinus infections     Discharge Instructions      Continue to push lots of fluids Use the Dymista  twice a day to help with the sinus congestion and drainage May continue with Mucinex DM (guaifenesin DM) for moderate cough.  Take Tessalon  if cough is severe If you fail to see improvement over the next couple of days, or if your symptoms worsen I have given you a written prescription for a Z-Pak to fill and take   ED Prescriptions     Medication Sig Dispense Auth. Provider   Azelastine -Fluticasone  137-50 MCG/ACT SUSP Place 1 puff into the nose in the morning and at bedtime. 23 g Maranda Jamee Jacob, MD   benzonatate  (TESSALON ) 200 MG capsule Take 1 capsule (200 mg total) by mouth 3 (three) times daily as needed for cough. 21 capsule Maranda,  Jamee Jacob, MD   azithromycin  (ZITHROMAX ) 250 MG tablet  Take 1 tablet (250 mg total) by mouth daily. Take first 2 tablets together, then 1 every day until finished. 6 tablet Maranda Jamee Jacob, MD      PDMP not reviewed this encounter.    [1]  Social History Tobacco Use   Smoking status: Never   Smokeless tobacco: Never  Vaping Use   Vaping status: Never Used  Substance Use Topics   Alcohol use: Yes    Comment: occ   Drug use: No     Maranda Jamee Jacob, MD 05/19/24 2044  "
